# Patient Record
Sex: Female | Born: 1945 | Race: Black or African American | Hispanic: No
Health system: Southern US, Community
[De-identification: ages and names within clinical notes are randomized; demographics above are authoritative.]

---

## 2013-09-25 ENCOUNTER — Emergency Department (HOSPITAL_COMMUNITY)
Admission: EM | Admit: 2013-09-25 | Discharge: 2013-09-25 | Disposition: A | Discharge status: Home / Self Care | Payer: Medicare (Managed Care) | Attending: Emergency Medicine | Admitting: Emergency Medicine

## 2013-09-25 ENCOUNTER — Emergency Department (HOSPITAL_COMMUNITY): Payer: Medicare (Managed Care)

## 2013-09-25 ENCOUNTER — Encounter (HOSPITAL_COMMUNITY): Payer: Self-pay | Admitting: Emergency Medicine

## 2013-09-25 DIAGNOSIS — IMO0002 Reserved for concepts with insufficient information to code with codable children: Secondary | ICD-10-CM | POA: Diagnosis not present

## 2013-09-25 DIAGNOSIS — S199XXA Unspecified injury of neck, initial encounter: Secondary | ICD-10-CM

## 2013-09-25 DIAGNOSIS — R42 Dizziness and giddiness: Secondary | ICD-10-CM | POA: Insufficient documentation

## 2013-09-25 DIAGNOSIS — S0993XA Unspecified injury of face, initial encounter: Secondary | ICD-10-CM | POA: Diagnosis not present

## 2013-09-25 DIAGNOSIS — S42413A Displaced simple supracondylar fracture without intercondylar fracture of unspecified humerus, initial encounter for closed fracture: Secondary | ICD-10-CM | POA: Insufficient documentation

## 2013-09-25 DIAGNOSIS — S298XXA Other specified injuries of thorax, initial encounter: Secondary | ICD-10-CM | POA: Insufficient documentation

## 2013-09-25 DIAGNOSIS — Y92410 Unspecified street and highway as the place of occurrence of the external cause: Secondary | ICD-10-CM | POA: Diagnosis not present

## 2013-09-25 DIAGNOSIS — Z79899 Other long term (current) drug therapy: Secondary | ICD-10-CM | POA: Insufficient documentation

## 2013-09-25 DIAGNOSIS — S46909A Unspecified injury of unspecified muscle, fascia and tendon at shoulder and upper arm level, unspecified arm, initial encounter: Secondary | ICD-10-CM | POA: Diagnosis present

## 2013-09-25 DIAGNOSIS — S42412A Displaced simple supracondylar fracture without intercondylar fracture of left humerus, initial encounter for closed fracture: Secondary | ICD-10-CM

## 2013-09-25 DIAGNOSIS — Y9389 Activity, other specified: Secondary | ICD-10-CM | POA: Insufficient documentation

## 2013-09-25 DIAGNOSIS — R0789 Other chest pain: Secondary | ICD-10-CM

## 2013-09-25 DIAGNOSIS — S4980XA Other specified injuries of shoulder and upper arm, unspecified arm, initial encounter: Secondary | ICD-10-CM | POA: Diagnosis present

## 2013-09-25 MED ORDER — TRAMADOL HCL 50 MG PO TABS: 50.0000 mg | ORAL_TABLET | Freq: Four times a day (QID) | ORAL | Status: AC | PRN

## 2013-09-25 MED ORDER — IBUPROFEN 400 MG PO TABS: 400.0000 mg | ORAL_TABLET | Freq: Four times a day (QID) | ORAL | Status: AC | PRN

## 2013-09-25 MED ORDER — TRAMADOL HCL 50 MG PO TABS
50.0000 mg | ORAL_TABLET | Freq: Once | ORAL | Status: AC
  Administered 2013-09-25: 50 mg via ORAL
  Filled 2013-09-25: qty 1
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
## 2013-09-25 NOTE — ED Notes (Signed)
Patient transported to X-ray 

## 2013-09-25 NOTE — Discharge Instructions (Signed)
Please follow up with your primary care physician in [place] when you return in the next day or two to have them schedule you a followup with an orthopedist. Please take pain medication and/or muscle relaxants as prescribed and as needed for pain. Please do not drive on narcotic pain medication or on muscle relaxants. Please read all discharge instructions and return precautions.   Humerus Fracture, Treated with Immobilization The humerus is the large bone in your upper arm. You have a broken (fractured) humerus. These fractures are easily diagnosed with X-rays. TREATMENT  Simple fractures which will heal without disability are treated with simple immobilization. Immobilization means you will wear a cast, splint, or sling. You have a fracture which will do well with immobilization. The fracture will heal well simply by being held in a good position until it is stable enough to begin range of motion exercises. Do not take part in activities which would further injure your arm.  HOME CARE INSTRUCTIONS   Put ice on the injured area.  Put ice in a plastic bag.  Place a towel between your skin and the bag.  Leave the ice on for 15-20 minutes, 03-04 times a day.  If you have a cast:  Do not scratch the skin under the cast using sharp or pointed objects.  Check the skin around the cast every day. You may put lotion on any red or sore areas.  Keep your cast dry and clean.  If you have a splint:  Wear the splint as directed.  Keep your splint dry and clean.  You may loosen the elastic around the splint if your fingers become numb, tingle, or turn cold or blue.  If you have a sling:  Wear the sling as directed.  Do not put pressure on any part of your cast or splint until it is fully hardened.  Your cast or splint can be protected during bathing with a plastic bag. Do not lower the cast or splint into water.  Only take over-the-counter or prescription medicines for pain, discomfort, or  fever as directed by your caregiver.  Do range of motion exercises as instructed by your caregiver.  Follow up as directed by your caregiver. This is very important in order to avoid permanent injury or disability and chronic pain. SEEK IMMEDIATE MEDICAL CARE IF:   Your skin or nails in the injured arm turn blue or gray.  Your arm feels cold or numb.  You develop severe pain in the injured arm.  You are having problems with the medicines you were given. MAKE SURE YOU:   Understand these instructions.  Will watch your condition.  Will get help right away if you are not doing well or get worse. Document Released: 06/16/2000 Document Revised: 06/02/2011 Document Reviewed: 04/24/2010 [facility] Patient Information 2015 [employer], [place]. This information is not intended to replace advice given to you by your health care provider. Make sure you discuss any questions you have with your health care provider.   Cast or Splint Care Casts and splints support injured limbs and keep bones from moving while they heal. It is important to care for your cast or splint at home.  HOME CARE INSTRUCTIONS  Keep the cast or splint uncovered during the drying period. It can take 24 to 48 hours to dry if it is made of plaster. A fiberglass cast will dry in less than 1 hour.  Do not rest the cast on anything harder than a pillow for the first 24  hours.  Do not put weight on your injured limb or apply pressure to the cast until your health care provider gives you permission.  Keep the cast or splint dry. Wet casts or splints can lose their shape and may not support the limb as well. A wet cast that has lost its shape can also create harmful pressure on your skin when it dries. Also, wet skin can become infected.  Cover the cast or splint with a plastic bag when bathing or when out in the rain or snow. If the cast is on the trunk of the body, take sponge baths until the cast is removed.  If your cast does  become wet, dry it with a towel or a blow dryer on the cool setting only.  Keep your cast or splint clean. Soiled casts may be wiped with a moistened cloth.  Do not place any hard or soft foreign objects under your cast or splint, such as cotton, toilet paper, lotion, or powder.  Do not try to scratch the skin under the cast with any object. The object could get stuck inside the cast. Also, scratching could lead to an infection. If itching is a problem, use a blow dryer on a cool setting to relieve discomfort.  Do not trim or cut your cast or remove padding from inside of it.  Exercise all joints next to the injury that are not immobilized by the cast or splint. For example, if you have a long leg cast, exercise the hip joint and toes. If you have an arm cast or splint, exercise the shoulder, elbow, thumb, and fingers.  Elevate your injured arm or leg on 1 or 2 pillows for the first 1 to 3 days to decrease swelling and pain.It is best if you can comfortably elevate your cast so it is higher than your heart. SEEK MEDICAL CARE IF:   Your cast or splint cracks.  Your cast or splint is too tight or too loose.  You have unbearable itching inside the cast.  Your cast becomes wet or develops a soft spot or area.  You have a bad smell coming from inside your cast.  You get an object stuck under your cast.  Your skin around the cast becomes red or raw.  You have new pain or worsening pain after the cast has been applied. SEEK IMMEDIATE MEDICAL CARE IF:   You have fluid leaking through the cast.  You are unable to move your fingers or toes.  You have discolored (blue or white), cool, painful, or very swollen fingers or toes beyond the cast.  You have tingling or numbness around the injured area.  You have severe pain or pressure under the cast.  You have any difficulty with your breathing or have shortness of breath.  You have chest pain. Document Released: 03/07/2000 Document  Revised: 12/29/2012 Document Reviewed: 09/16/2012 [facility] Patient Information 2015 [place], [place]. This information is not intended to replace advice given to you by your health care provider. Make sure you discuss any questions you have with your health care provider.

## 2013-10-03 NOTE — ED Provider Notes (Signed)
Medical screening examination/treatment/procedure(s) were performed by non-physician practitioner and as supervising physician I was immediately available for consultation/collaboration.   EKG Interpretation None       Jonan Seufert, MD 10/03/13 1005 
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
